# Patient Record
Sex: Male | Born: 2005 | Race: White | Marital: Single | Smoking: Never smoker
Health system: Southern US, Community
[De-identification: ages and names within clinical notes are randomized; demographics above are authoritative.]

## PROBLEM LIST (undated history)

## (undated) DIAGNOSIS — R159 Full incontinence of feces: Secondary | ICD-10-CM

## (undated) DIAGNOSIS — F329 Major depressive disorder, single episode, unspecified: Secondary | ICD-10-CM

## (undated) DIAGNOSIS — F32A Depression, unspecified: Secondary | ICD-10-CM

## (undated) DIAGNOSIS — K5900 Constipation, unspecified: Secondary | ICD-10-CM

## (undated) HISTORY — DX: Constipation, unspecified: K59.00

## (undated) HISTORY — DX: Full incontinence of feces: R15.9

---
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
## 2024-04-24 ENCOUNTER — Emergency Department (HOSPITAL_BASED_OUTPATIENT_CLINIC_OR_DEPARTMENT_OTHER)
Admission: EM | Admit: 2024-04-24 | Discharge: 2024-04-24 | Disposition: A | Discharge status: Home / Self Care | Payer: Worker's Compensation

## 2024-04-24 ENCOUNTER — Other Ambulatory Visit: Payer: Self-pay

## 2024-04-24 ENCOUNTER — Encounter (HOSPITAL_BASED_OUTPATIENT_CLINIC_OR_DEPARTMENT_OTHER): Payer: Self-pay

## 2024-04-24 DIAGNOSIS — Z7982 Long term (current) use of aspirin: Secondary | ICD-10-CM | POA: Insufficient documentation

## 2024-04-24 DIAGNOSIS — T7840XA Allergy, unspecified, initial encounter: Secondary | ICD-10-CM | POA: Diagnosis present

## 2024-04-24 MED ORDER — METHYLPREDNISOLONE SODIUM SUCC 125 MG IJ SOLR
125.0000 mg | Freq: Once | INTRAMUSCULAR | Status: AC
  Administered 2024-04-24: 125 mg via INTRAVENOUS
  Filled 2024-04-24: qty 2

## 2024-04-24 MED ORDER — PREDNISONE 20 MG PO TABS: 40.0000 mg | ORAL_TABLET | Freq: Every day | ORAL | 0 refills | Status: AC

## 2024-04-24 MED ORDER — DIPHENHYDRAMINE HCL 50 MG/ML IJ SOLN
25.0000 mg | Freq: Once | INTRAMUSCULAR | Status: AC
  Administered 2024-04-24: 25 mg via INTRAVENOUS
  Filled 2024-04-24: qty 1

## 2024-04-24 MED ORDER — FAMOTIDINE IN NACL 20-0.9 MG/50ML-% IV SOLN
20.0000 mg | Freq: Once | INTRAVENOUS | Status: AC
  Administered 2024-04-24: 20 mg via INTRAVENOUS
  Filled 2024-04-24: qty 50

## 2024-04-24 NOTE — ED Triage Notes (Signed)
 Pt bib EMS from work after believing to be stung by a bee. Pt reported difficulty breathing initially, no dyspnea or angioedema at this time. Pt reports generalized pain and feeling tired at this time. NAD noted in triage.   Pt states he felt a sting/bite to lower back, denies any reactions to being stung by a bee in the past.   130/80, HR 104, 16 resp, 98% RA.

## 2024-04-24 NOTE — Discharge Instructions (Addendum)
 You were given a short course of steroids to help with your symptoms.  Please take 2 tablets daily for the next 5 days.  If you experience any shortness of breath, chest pain, nausea or vomiting you will need to return to the emergency department.

## 2024-04-24 NOTE — ED Provider Notes (Signed)
 [place] EMERGENCY DEPARTMENT AT [facility] [place] Provider Note   CSN: [id number] Arrival date & time: 04/24/24  1449     Patient presents with: Allergic Reaction   Kevin Morrison is a 18 y.o. male.   18 y.o male with no PMH presents to the ED s/p allergic reaction. Patient reports he was at work when suddenly he was stung by what he believes was a bee.  Reports immediate feeling flushed, felt nauseated, felt like he was somewhat short of breath.  He was not given any medication by first responders.  He reports no prior allergic reactions.  He now endorses some nausea, has not had any vomiting or rash present.  He still feels somewhat short of breath.  No prior history of this.  No other complaints reported.  The history is provided by the patient.  Allergic Reaction Presenting symptoms: no rash        Prior to Admission medications   Medication Sig Start Date End Date Taking? Authorizing Provider  predniSONE (DELTASONE) 20 MG tablet Take 2 tablets (40 mg total) by mouth daily for 5 days. 04/24/24 04/29/24 Yes Stephine Langbehn, PA-C  albuterol  (VENTOLIN  HFA) 108 (90 Base) MCG/ACT inhaler Inhale 2 puffs into the lungs every 4 (four) hours as needed for wheezing or shortness of breath (cough, shortness of breath or wheezing.). 02/08/19   Arloa Suzen RAMAN, NP  aspirin -acetaminophen -caffeine  (EXCEDRIN  MIGRAINE) 250-250-65 MG tablet Take 2 tablets by mouth daily as needed for headache. 11/05/17   Fitzgerald, Hillary Moen, MD  buPROPion (WELLBUTRIN XL) 150 MG 24 hr tablet TK 1 T PO QAM 09/01/18   [provider]  cloNIDine (CATAPRES) 0.1 MG tablet TK 1 T PO BID 10/28/17   [provider]  clotrimazole  (LOTRIMIN ) 1 % cream APPLY EXTERNALLY TO THE AFFECTED AREA TWICE DAILY 03/10/19   Delane Lye, MD  VYVANSE  30 MG capsule Take 40 mg by mouth daily.  05/10/14   [provider]    Allergies: Patient has no known allergies.    Review of Systems   Constitutional:  Negative for chills and fever.  HENT:  Negative for sore throat.   Respiratory:  Positive for shortness of breath.   Cardiovascular:  Negative for chest pain.  Gastrointestinal:  Positive for nausea. Negative for abdominal pain and vomiting.  Genitourinary:  Negative for flank pain.  Skin:  Negative for rash.  All other systems reviewed and are negative.   Updated Vital Signs BP 129/84   Pulse 89   Temp 98.3 F (36.8 C)   Resp 15   Ht 6' 2 (1.88 m)   Wt 86.2 kg   SpO2 99%   BMI 24.39 kg/m   Physical Exam Vitals and nursing note reviewed.  Constitutional:      Appearance: Normal appearance.  HENT:     Head: Normocephalic and atraumatic.     Mouth/Throat:     Mouth: Mucous membranes are moist.     Pharynx: No oropharyngeal exudate or posterior oropharyngeal erythema.     Comments: Oropharynx is clear without erythema or edema.  Cardiovascular:     Rate and Rhythm: Normal rate.  Pulmonary:     Effort: Pulmonary effort is normal.     Breath sounds: No wheezing or rales.     Comments: Lungs are clear without any wheezing.  Abdominal:     General: Abdomen is flat.     Palpations: Abdomen is soft.     Tenderness: There is no abdominal tenderness.  Musculoskeletal:     Cervical back: Normal range of motion and neck supple.  Skin:    General: Skin is warm and dry.     Findings: No rash.  Neurological:     Mental Status: He is alert and oriented to person, place, and time.     (all labs ordered are listed, but only abnormal results are displayed) Labs Reviewed - No data to display  EKG: None  Radiology: No results found.   Procedures   Medications Ordered in the ED  diphenhydrAMINE  (BENADRYL ) injection 25 mg (25 mg Intravenous Given 04/24/24 1527)  famotidine (PEPCID) IVPB 20 mg premix (20 mg Intravenous New Bag/Given 04/24/24 1526)  methylPREDNISolone sodium succinate (SOLU-MEDROL) 125 mg/2 mL injection 125 mg (125 mg Intravenous Given  04/24/24 1527)                                    Medical Decision Making Risk Prescription drug management.    This patient presents to the ED for concern of allergic reaction, this involves a number of treatment options, and is a complaint that carries with it a high risk of complications and morbidity.  The differential diagnosis includes    Co morbidities: Discussed in HPI   Brief History:  See HPI.   EMR reviewed including pt PMHx, past surgical history and past visits to ER.   See HPI for more details  Lab Tests:  I ordered and independently interpreted labs.  The pertinent results include:    N/A  Imaging Studies:  N/A  Cardiac Monitoring:  N/A  Medicines ordered:  I ordered medication including Benadryl , Solu-Medrol, Pepcid for symptomatic treatment Reevaluation of the patient after these medicines showed that the patient improved I have reviewed the patients home medicines and have made adjustments as needed  Reevaluation:  After the interventions noted above I re-evaluated patient and found that they have :improved  Social Determinants of Health:  The patient's social determinants of health were a factor in the care of this patient   Problem List / ED Course:  Patient presented to the ED with a chief complaint of rash, reports he was stung at his work.  States that he broke out into hives, reports that he felt very nauseated he felt very short of breath.  He read to the ED with stable vital signs, no hypoxia, no rash involving any of his upper or lower extremities.  He tells me that he is short of breath however there is no wheezing on my exam.  Oropharynx is clear without any erythema or edema.  He speaking in full sentences. He does tell me this has never happened in the past, he has no prior history of allergic reactions.  I do not suspect anaphylaxis at this time.  He did not receive any medication.  Will provide him with supportive  care. Given Solu-Medrol, Medrol, Pepcid to help with allergic reaction, he is overall in stable condition.  He is able to tolerate p.o., no longer nauseated but does feel sleepy.  Do feel that is propria to discharge on a short course of steroids.  Will go home with a steroid burst.  Mother at the bedside is agreeable to plan and treatment.  Return precautions discussed at length.  Patient hemodynamically stable for discharge.  Dispostion:  After consideration of the diagnostic results and the patients response to treatment, I feel that the patent would benefit  from take steroids to help with symptoms, close follow-up with PCP.   Portions of this note were generated with Scientist, clinical (histocompatibility and immunogenetics). Dictation errors may occur despite best attempts at proofreading.   Final diagnoses:  Allergic reaction, initial encounter    ED Discharge Orders          Ordered    predniSONE (DELTASONE) 20 MG tablet  Daily        04/24/24 1626               Johnryan Sao, PA-C 04/24/24 1636    Emil Share, DO 04/24/24 1947

## 2024-04-24 NOTE — ED Notes (Signed)
 ED Provider at bedside.
